# Patient Record
Sex: Male | Born: 1955 | Race: White | Hispanic: No | Marital: Married
Health system: Southern US, Community
[De-identification: ages and names within clinical notes are randomized; demographics above are authoritative.]

## PROBLEM LIST (undated history)

## (undated) DIAGNOSIS — C801 Malignant (primary) neoplasm, unspecified: Secondary | ICD-10-CM

## (undated) HISTORY — PX: TOTAL HIP ARTHROPLASTY: SHX124

---

## 2011-05-06 HISTORY — PX: PROSTATECTOMY: SHX69

## 2016-03-29 ENCOUNTER — Emergency Department (HOSPITAL_BASED_OUTPATIENT_CLINIC_OR_DEPARTMENT_OTHER): Payer: Managed Care, Other (non HMO)

## 2016-03-29 ENCOUNTER — Emergency Department (HOSPITAL_BASED_OUTPATIENT_CLINIC_OR_DEPARTMENT_OTHER)
Admission: EM | Admit: 2016-03-29 | Discharge: 2016-03-29 | Disposition: A | Discharge status: Home / Self Care | Payer: Managed Care, Other (non HMO) | Attending: Emergency Medicine | Admitting: Emergency Medicine

## 2016-03-29 ENCOUNTER — Encounter (HOSPITAL_BASED_OUTPATIENT_CLINIC_OR_DEPARTMENT_OTHER): Payer: Self-pay | Admitting: *Deleted

## 2016-03-29 DIAGNOSIS — Z8546 Personal history of malignant neoplasm of prostate: Secondary | ICD-10-CM | POA: Insufficient documentation

## 2016-03-29 DIAGNOSIS — B349 Viral infection, unspecified: Secondary | ICD-10-CM | POA: Diagnosis not present

## 2016-03-29 DIAGNOSIS — Z79899 Other long term (current) drug therapy: Secondary | ICD-10-CM | POA: Diagnosis not present

## 2016-03-29 DIAGNOSIS — R05 Cough: Secondary | ICD-10-CM | POA: Diagnosis present

## 2016-03-29 HISTORY — DX: Malignant (primary) neoplasm, unspecified: C80.1

## 2016-03-29 LAB — CBC
HCT: 34.4 % — ABNORMAL LOW (ref 39.0–52.0)
HEMOGLOBIN: 11.6 g/dL — AB (ref 13.0–17.0)
MCH: 33.2 pg (ref 26.0–34.0)
MCHC: 33.7 g/dL (ref 30.0–36.0)
MCV: 98.6 fL (ref 78.0–100.0)
Platelets: 273 10*3/uL (ref 150–400)
RBC: 3.49 MIL/uL — AB (ref 4.22–5.81)
RDW: 12.9 % (ref 11.5–15.5)
WBC: 3.6 10*3/uL — AB (ref 4.0–10.5)

## 2016-03-29 LAB — BASIC METABOLIC PANEL
ANION GAP: 9 (ref 5–15)
BUN: 12 mg/dL (ref 6–20)
CHLORIDE: 107 mmol/L (ref 101–111)
CO2: 20 mmol/L — ABNORMAL LOW (ref 22–32)
Calcium: 8.6 mg/dL — ABNORMAL LOW (ref 8.9–10.3)
Creatinine, Ser: 0.82 mg/dL (ref 0.61–1.24)
GFR calc Af Amer: >60 mL/min (ref >60)
GFR calc non Af Amer: >60 mL/min (ref >60)
Glucose, Bld: 108 mg/dL — ABNORMAL HIGH (ref 65–99)
POTASSIUM: 4.2 mmol/L (ref 3.5–5.1)
SODIUM: 136 mmol/L (ref 135–145)

## 2016-03-29 NOTE — ED Provider Notes (Signed)
[facility] DEPT [facility] Provider Note   CSN: [id number] Arrival date & time: 03/29/16  1122  History   Chief Complaint Chief Complaint  Patient presents with  . Influenza    HPI Antonio Dickerson is a 60 y.o. male.  HPI  60 y.o. male with a hx of Prostate Cancer, presents to the Emergency Department today complaining of cough/congestion x 2 days. Associated chills, sinus congestion. Notes cough is productive with green sputum. Low grade fevers at home (100.68F). Pt currently undergoing chemotherapy/immunotherapy at [facility]. Receives treatment once every 3 weeks. Recently started 2-3 weeks ago. No CP/ABD pain. Pt sent by Oncologist due to concern for infection. No other symptoms noted.     Past Medical History:  Diagnosis Date  . Cancer [facility])    Prostate Cancer    There are no active problems to display for this patient.   Past Surgical History:  Procedure Laterality Date  . PROSTATECTOMY  2013  . TOTAL HIP ARTHROPLASTY Left        Home Medications    Prior to Admission medications   Medication Sig Start Date End Date Taking? Authorizing Provider  divalproex (DEPAKOTE) 500 MG DR tablet Take 500 mg by mouth daily.   Yes Historical Provider, MD  Leuprolide Acetate, 6 Month, (LUPRON DEPOT, 67-MONTH, IM) Inject into the muscle.   Yes Historical Provider, MD  PEMBROLIZUMAB IV Inject into the vein.   Yes Historical Provider, MD  predniSONE (DELTASONE) 10 MG tablet Take 10 mg by mouth daily with breakfast.   Yes Historical Provider, MD    Family History No family history on file.  Social History Social History  Substance Use Topics  . Smoking status: Not on file  . Smokeless tobacco: Not on file  . Alcohol use Not on file     Allergies   Fluconazole   Review of Systems Review of Systems ROS reviewed and all are negative for acute change except as noted in the HPI.  Physical Exam Updated Vital Signs BP 113/78 (BP Location: Left Arm)   Pulse 88   Temp 98.4 F  (36.9 C)   Resp 20   Ht 5\' 10"  (1.778 m)   Wt 111.1 kg   SpO2 98%   BMI 35.15 kg/m   Physical Exam  Constitutional: He is oriented to person, place, and time. Vital signs are normal. He appears well-developed and well-nourished. No distress.  HENT:  Head: Normocephalic and atraumatic.  Right Ear: Hearing, tympanic membrane, external ear and ear canal normal.  Left Ear: Hearing, tympanic membrane, external ear and ear canal normal.  Nose: Nose normal.  Mouth/Throat: Uvula is midline, oropharynx is clear and moist and mucous membranes are normal. No trismus in the jaw. No oropharyngeal exudate, posterior oropharyngeal erythema or tonsillar abscesses.  Eyes: Conjunctivae and EOM are normal. Pupils are equal, round, and reactive to light.  Neck: Normal range of motion. Neck supple. No tracheal deviation present.  Cardiovascular: Normal rate, regular rhythm, S1 normal, S2 normal, normal heart sounds, intact distal pulses and normal pulses.   Pulmonary/Chest: Effort normal and breath sounds normal. No respiratory distress. He has no decreased breath sounds. He has no wheezes. He has no rhonchi. He has no rales.  Abdominal: Normal appearance and bowel sounds are normal. There is no tenderness.  Musculoskeletal: Normal range of motion.  Neurological: He is alert and oriented to person, place, and time.  Skin: Skin is warm and dry.  Psychiatric: He has a normal mood and affect. His speech  is normal and behavior is normal. Thought content normal.  Nursing note and vitals reviewed.  ED Treatments / Results  Labs (all labs ordered are listed, but only abnormal results are displayed) Labs Reviewed  CBC - Abnormal; Notable for the following:       Result Value   WBC 3.6 (*)    RBC 3.49 (*)    Hemoglobin 11.6 (*)    HCT 34.4 (*)    All other components within normal limits  BASIC METABOLIC PANEL - Abnormal; Notable for the following:    CO2 20 (*)    Glucose, Bld 108 (*)    Calcium 8.6 (*)     All other components within normal limits    EKG  EKG Interpretation None      Radiology Dg Chest 2 View  Result Date: 03/29/2016 CLINICAL DATA:  Fever and weakness EXAM: CHEST  2 VIEW COMPARISON:  None. FINDINGS: The heart size and mediastinal contours are within normal limits. Both lungs are clear. The visualized skeletal structures are unremarkable. IMPRESSION: No active cardiopulmonary disease. Electronically Signed   By: Inez Catalina M.D.   On: 03/29/2016 13:19    Procedures Procedures (including critical care time)  Medications Ordered in ED Medications - No data to display   Initial Impression / Assessment and Plan / ED Course  I have reviewed the triage vital signs and the nursing notes.  Pertinent labs & imaging results that were available during my care of the patient were reviewed by me and considered in my medical decision making (see chart for details).  Clinical Course    Final Clinical Impressions(s) / ED Diagnoses  {I have reviewed and evaluated the relevant laboratory values. {I have reviewed and evaluated the relevant imaging studies.  {I have reviewed the relevant previous healthcare records.  {I obtained HPI from historian. {Patient discussed with supervising physician.  ED Course:  Assessment: Pt is a 60yM with hx Prostate Cancer undergoing chemo/immunotherapy who presents with cough x 2 days. Sent by oncologist with concern for infection. On exam, pt in NAD. Nontoxic/nonseptic appearing. VSS. Afebrile. Lungs CTA. Heart RRR. Abdomen nontender soft. Posterior oropharynx and Bilateral TMs unremarkable. Labs unremarkable. CXR unremarkable. Low indication for influenza given VS. Likely Viral URI Plan is to DC home with follow up to PCP. At time of discharge, Patient is in no acute distress. Vital Signs are stable. Patient is able to ambulate. Patient able to tolerate PO.   Disposition/Plan:  DC Home Additional Verbal discharge instructions given and  discussed with patient.  Pt Instructed to f/u with PCP in the next week for evaluation and treatment of symptoms. Return precautions given Pt acknowledges and agrees with plan  Supervising Physician Julianne Rice, MD  Final diagnoses:  Viral syndrome    New Prescriptions New Prescriptions   No medications on file     Shary Decamp, PA-C 03/29/16 1356    Julianne Rice, MD 03/30/16 1401

## 2016-03-29 NOTE — ED Notes (Signed)
Pt reports last dose of tylenol at 8 this morning.

## 2016-03-29 NOTE — Discharge Instructions (Signed)
Please read and follow all provided instructions.  Your diagnoses today include:  1. Viral syndrome    You appear to have an upper respiratory infection (URI). An upper respiratory tract infection, or cold, is a viral infection of the air passages leading to the lungs. It should improve gradually after 5-7 days. You may have a lingering cough that lasts for 2- 4 weeks after the infection.  Tests performed today include: Vital signs. See below for your results today.   Medications prescribed:   Take any prescribed medications only as directed. Treatment for your infection is aimed at treating the symptoms. There are no medications, such as antibiotics, that will cure your infection.   Home care instructions:  Follow any educational materials contained in this packet.   Your illness is contagious and can be spread to others, especially during the first 3 or 4 days. It cannot be cured by antibiotics or other medicines. Take basic precautions such as washing your hands often, covering your mouth when you cough or sneeze, and avoiding public places where you could spread your illness to others.   Please continue drinking plenty of fluids.  Use over-the-counter medicines as needed as directed on packaging for symptom relief.  You may also use ibuprofen or tylenol as directed on packaging for pain or fever.  Do not take multiple medicines containing Tylenol or acetaminophen to avoid taking too much of this medication.  Follow-up instructions: Please follow-up with your primary care provider in the next 3 days for further evaluation of your symptoms if you are not feeling better.   Return instructions:  Please return to the Emergency Department if you experience worsening symptoms.  RETURN IMMEDIATELY IF you develop shortness of breath, confusion or altered mental status, a new rash, become dizzy, faint, or poorly responsive, or are unable to be cared for at home. Please return if you have  persistent vomiting and cannot keep down fluids or develop a fever that is not controlled by tylenol or motrin.   Please return if you have any other emergent concerns.  Additional Information:  Your vital signs today were: BP 113/78 (BP Location: Left Arm)    Pulse 88    Temp 98.4 F (36.9 C)    Resp 20    Ht 5\' 10"  (1.778 m)    Wt 111.1 kg    SpO2 98%    BMI 35.15 kg/m  If your blood pressure (BP) was elevated above 135/85 this visit, please have this repeated by your doctor within one month. --------------

## 2016-03-29 NOTE — ED Notes (Signed)
ED Provider at bedside. 

## 2016-03-29 NOTE — ED Triage Notes (Signed)
Patient states he has a two day history of low grade fever (100.8), productive cough with greenish secretions, chills, sore throat , and body aches.  Patient is currently receiving chemo at [facility] and was asked to come in to be checked.

## 2017-06-21 IMAGING — CR DG CHEST 2V
2 series · 2 of 2 positions shown · non-contrast
Comparison: None.

CLINICAL DATA: Fever and weakness

EXAM:
CHEST  2 VIEW

[w chest pa]
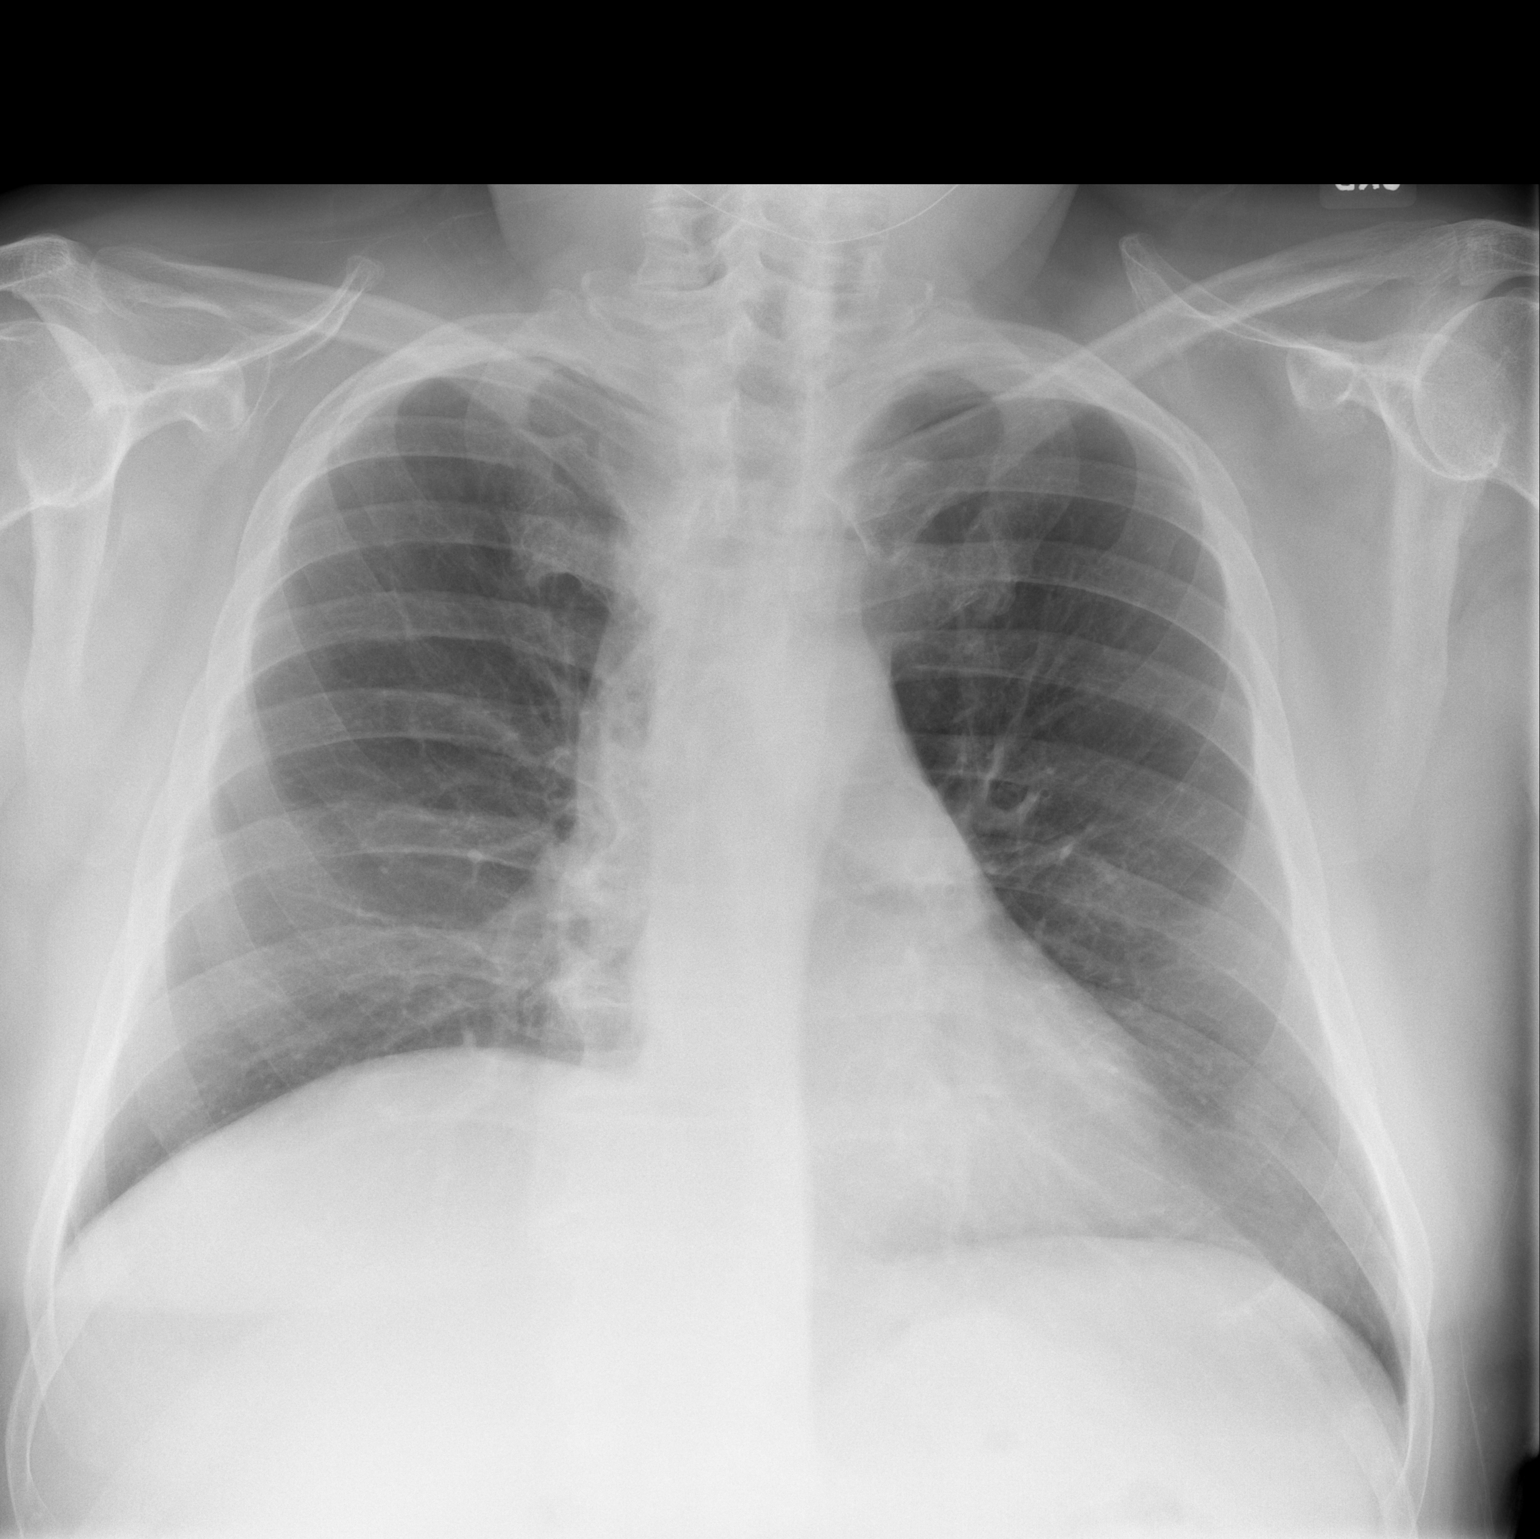

[w chest lat]
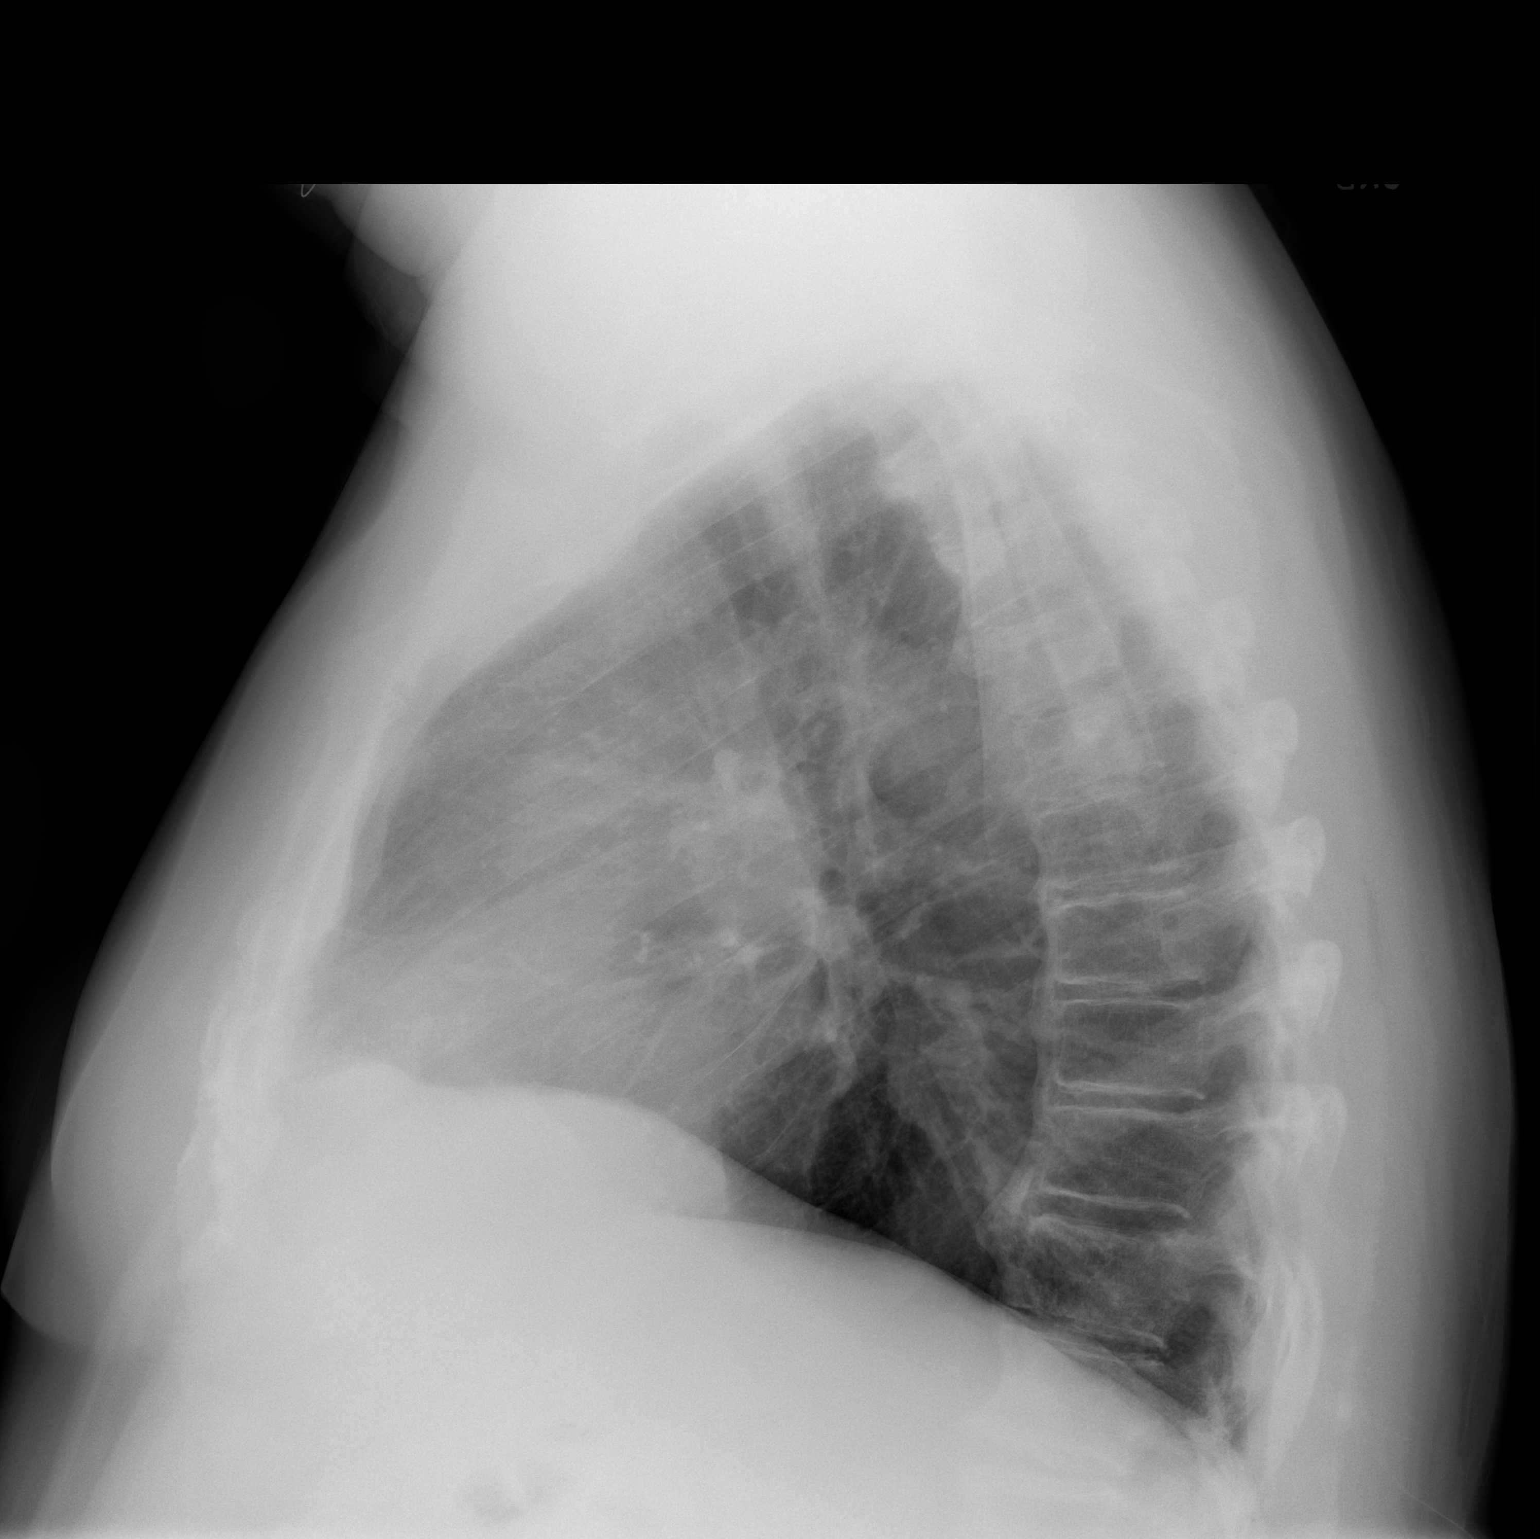

[2 of 2 positions shown; findings below may reference images not displayed]

FINDINGS: The heart size and mediastinal contours are within normal limits.
Both lungs are clear. The visualized skeletal structures are
unremarkable.
IMPRESSION: No active cardiopulmonary disease.
